# Patient Record
Sex: Female | Born: 1960 | Race: White | Hispanic: No | Marital: Married | Smoking: Never smoker
Health system: Southern US, Community
[De-identification: ages and names within clinical notes are randomized; demographics above are authoritative.]

## PROBLEM LIST (undated history)

## (undated) DIAGNOSIS — Z8619 Personal history of other infectious and parasitic diseases: Secondary | ICD-10-CM

## (undated) DIAGNOSIS — J3089 Other allergic rhinitis: Secondary | ICD-10-CM

## (undated) DIAGNOSIS — M1990 Unspecified osteoarthritis, unspecified site: Secondary | ICD-10-CM

## (undated) DIAGNOSIS — K5140 Inflammatory polyps of colon without complications: Secondary | ICD-10-CM

## (undated) DIAGNOSIS — Z87440 Personal history of urinary (tract) infections: Secondary | ICD-10-CM

## (undated) DIAGNOSIS — G43909 Migraine, unspecified, not intractable, without status migrainosus: Secondary | ICD-10-CM

## (undated) DIAGNOSIS — Z789 Other specified health status: Secondary | ICD-10-CM

## (undated) HISTORY — PX: TUBAL LIGATION: SHX77

## (undated) HISTORY — DX: Unspecified osteoarthritis, unspecified site: M19.90

## (undated) HISTORY — PX: OTHER SURGICAL HISTORY: SHX169

## (undated) HISTORY — DX: Migraine, unspecified, not intractable, without status migrainosus: G43.909

## (undated) HISTORY — PX: BLADDER SURGERY: SHX569

## (undated) HISTORY — DX: Inflammatory polyps of colon without complications: K51.40

## (undated) HISTORY — DX: Personal history of other infectious and parasitic diseases: Z86.19

## (undated) HISTORY — DX: Personal history of urinary (tract) infections: Z87.440

## (undated) HISTORY — PX: ENDOMETRIAL ABLATION: SHX621

## (undated) HISTORY — PX: URETHRAL DILATION: SUR417

---

## 2000-09-16 HISTORY — PX: TUBAL LIGATION: SHX77

## 2004-10-16 ENCOUNTER — Ambulatory Visit: Payer: Self-pay | Admitting: Obstetrics and Gynecology

## 2006-09-16 HISTORY — PX: ENDOMETRIAL ABLATION: SHX621

## 2007-02-10 ENCOUNTER — Ambulatory Visit: Payer: Self-pay | Admitting: Obstetrics and Gynecology

## 2007-03-18 ENCOUNTER — Ambulatory Visit: Payer: Self-pay | Admitting: Obstetrics and Gynecology

## 2007-03-23 ENCOUNTER — Ambulatory Visit: Payer: Self-pay | Admitting: Obstetrics and Gynecology

## 2008-03-08 ENCOUNTER — Ambulatory Visit: Payer: Self-pay | Admitting: Obstetrics and Gynecology

## 2009-03-22 ENCOUNTER — Ambulatory Visit: Payer: Self-pay | Admitting: Obstetrics and Gynecology

## 2010-05-15 ENCOUNTER — Ambulatory Visit: Payer: Self-pay | Admitting: Obstetrics and Gynecology

## 2011-05-21 ENCOUNTER — Ambulatory Visit: Payer: Self-pay | Admitting: Obstetrics and Gynecology

## 2012-05-22 ENCOUNTER — Ambulatory Visit: Payer: Self-pay | Admitting: Obstetrics and Gynecology

## 2014-06-22 ENCOUNTER — Ambulatory Visit: Payer: Self-pay | Admitting: Obstetrics and Gynecology

## 2014-07-29 ENCOUNTER — Ambulatory Visit: Payer: Self-pay | Admitting: Unknown Physician Specialty

## 2015-01-09 LAB — SURGICAL PATHOLOGY

## 2015-06-15 ENCOUNTER — Other Ambulatory Visit: Payer: Self-pay | Admitting: Obstetrics and Gynecology

## 2015-06-15 DIAGNOSIS — Z1231 Encounter for screening mammogram for malignant neoplasm of breast: Secondary | ICD-10-CM

## 2015-06-28 ENCOUNTER — Ambulatory Visit: Payer: Self-pay

## 2015-07-10 ENCOUNTER — Ambulatory Visit: Payer: Self-pay

## 2015-07-13 ENCOUNTER — Ambulatory Visit
Admission: RE | Admit: 2015-07-13 | Discharge: 2015-07-13 | Disposition: A | Discharge status: Home / Self Care | Payer: 59 | Source: Ambulatory Visit | Attending: Obstetrics and Gynecology | Admitting: Obstetrics and Gynecology

## 2015-07-13 DIAGNOSIS — Z1231 Encounter for screening mammogram for malignant neoplasm of breast: Secondary | ICD-10-CM | POA: Diagnosis present

## 2016-06-25 ENCOUNTER — Telehealth: Payer: Self-pay | Admitting: Family Medicine

## 2016-06-25 NOTE — Telephone Encounter (Signed)
Pt was last seen 12/01/2012 and was given Nitrofurantoin Monohyd Macro 100MG  for UTI. Pt stated that she thinks she has another UTI and would like this called into [place]. Pt stated that she changed jobs and her insurance hasn't started yet and that is why she would rather have Rx called in and not come in for OV. Pt stated that she hasn't come in because she has been healthy. I advised pt would need to schedule OV but pt wanted message sent first. If pt needs OV can we re-establish? Please advise. Thanks TNP

## 2016-06-25 NOTE — Telephone Encounter (Signed)
Need to check urinalysis in the office. Can re-establish patient. If she prefers, she could go to and Urgent [facility].

## 2016-06-26 NOTE — Telephone Encounter (Signed)
LMTCB

## 2017-02-06 ENCOUNTER — Encounter: Payer: Self-pay | Admitting: *Deleted

## 2017-02-11 ENCOUNTER — Ambulatory Visit
Admission: RE | Admit: 2017-02-11 | Discharge: 2017-02-11 | Disposition: A | Discharge status: Home / Self Care | Payer: BLUE CROSS/BLUE SHIELD | Source: Ambulatory Visit | Attending: Ophthalmology | Admitting: Ophthalmology

## 2017-02-11 ENCOUNTER — Ambulatory Visit: Payer: BLUE CROSS/BLUE SHIELD | Admitting: Anesthesiology

## 2017-02-11 ENCOUNTER — Encounter: Payer: Self-pay | Admitting: *Deleted

## 2017-02-11 ENCOUNTER — Encounter
Admission: RE | Disposition: A | Discharge status: Home / Self Care | Payer: Self-pay | Source: Ambulatory Visit | Attending: Ophthalmology

## 2017-02-11 DIAGNOSIS — H2512 Age-related nuclear cataract, left eye: Secondary | ICD-10-CM | POA: Diagnosis not present

## 2017-02-11 HISTORY — DX: Other specified health status: Z78.9

## 2017-02-11 HISTORY — PX: CATARACT EXTRACTION W/PHACO: SHX586

## 2017-02-11 LAB — POCT PREGNANCY, URINE: Preg Test, Ur: NEGATIVE

## 2017-02-11 SURGERY — PHACOEMULSIFICATION, CATARACT, WITH IOL INSERTION
Anesthesia: Monitor Anesthesia Care | Site: Eye | Laterality: Left | Wound class: Clean

## 2017-02-11 MED ORDER — MIDAZOLAM HCL 2 MG/2ML IJ SOLN
INTRAMUSCULAR | Status: AC
  Filled 2017-02-11: qty 2

## 2017-02-11 MED ORDER — EPINEPHRINE PF 1 MG/ML IJ SOLN
INTRAOCULAR | Status: DC | PRN
  Administered 2017-02-11: 09:00:00 via OPHTHALMIC

## 2017-02-11 MED ORDER — ARMC OPHTHALMIC DILATING DROPS
OPHTHALMIC | Status: AC
  Administered 2017-02-11: 09:00:00
  Filled 2017-02-11: qty 0.4

## 2017-02-11 MED ORDER — POVIDONE-IODINE 5 % OP SOLN
OPHTHALMIC | Status: DC | PRN
  Administered 2017-02-11: 1 via OPHTHALMIC

## 2017-02-11 MED ORDER — MIDAZOLAM HCL 2 MG/2ML IJ SOLN
INTRAMUSCULAR | Status: DC | PRN
  Administered 2017-02-11: 2 mg via INTRAVENOUS

## 2017-02-11 MED ORDER — MOXIFLOXACIN HCL 0.5 % OP SOLN
OPHTHALMIC | Status: AC
  Filled 2017-02-11: qty 3

## 2017-02-11 MED ORDER — FENTANYL CITRATE (PF) 100 MCG/2ML IJ SOLN
INTRAMUSCULAR | Status: AC
  Filled 2017-02-11: qty 2

## 2017-02-11 MED ORDER — FENTANYL CITRATE (PF) 100 MCG/2ML IJ SOLN
INTRAMUSCULAR | Status: DC | PRN
  Administered 2017-02-11 (×2): 50 ug via INTRAVENOUS

## 2017-02-11 MED ORDER — CARBACHOL 0.01 % IO SOLN
INTRAOCULAR | Status: DC | PRN
  Administered 2017-02-11: 0.5 mL via INTRAOCULAR

## 2017-02-11 MED ORDER — ARMC OPHTHALMIC DILATING DROPS
1.0000 "application " | OPHTHALMIC | Status: AC
  Administered 2017-02-11 (×3): 1 via OPHTHALMIC

## 2017-02-11 MED ORDER — SODIUM CHLORIDE 0.9 % IV SOLN
INTRAVENOUS | Status: DC
  Administered 2017-02-11: 08:00:00 via INTRAVENOUS

## 2017-02-11 MED ORDER — EPINEPHRINE PF 1 MG/ML IJ SOLN
INTRAMUSCULAR | Status: AC
  Filled 2017-02-11: qty 2

## 2017-02-11 MED ORDER — MOXIFLOXACIN HCL 0.5 % OP SOLN
OPHTHALMIC | Status: DC | PRN
  Administered 2017-02-11: 0.2 mL via OPHTHALMIC

## 2017-02-11 MED ORDER — LIDOCAINE HCL (PF) 2 % IJ SOLN
INTRAMUSCULAR | Status: AC
  Filled 2017-02-11: qty 2

## 2017-02-11 MED ORDER — NA CHONDROIT SULF-NA HYALURON 40-17 MG/ML IO SOLN
INTRAOCULAR | Status: DC | PRN
  Administered 2017-02-11: 1 mL via INTRAOCULAR

## 2017-02-11 MED ORDER — POVIDONE-IODINE 5 % OP SOLN
OPHTHALMIC | Status: AC
  Filled 2017-02-11: qty 30

## 2017-02-11 MED ORDER — NA CHONDROIT SULF-NA HYALURON 40-17 MG/ML IO SOLN
INTRAOCULAR | Status: AC
  Filled 2017-02-11: qty 1

## 2017-02-11 MED ORDER — LIDOCAINE HCL (PF) 4 % IJ SOLN
INTRAOCULAR | Status: DC | PRN
  Administered 2017-02-11: 9 mL via OPHTHALMIC

## 2017-02-11 MED ORDER — MOXIFLOXACIN HCL 0.5 % OP SOLN: 1.0000 [drp] | OPHTHALMIC | Status: DC | PRN

## 2017-02-11 SURGICAL SUPPLY — 14 items
GLOVE BIO SURGEON STRL SZ8 (GLOVE) ×3 IMPLANT
GLOVE BIOGEL M 6.5 STRL (GLOVE) ×3 IMPLANT
GLOVE SURG LX 8.0 MICRO (GLOVE) ×2
GLOVE SURG LX STRL 8.0 MICRO (GLOVE) ×1 IMPLANT
GOWN STRL REUS W/ TWL LRG LVL3 (GOWN DISPOSABLE) ×2 IMPLANT
GOWN STRL REUS W/TWL LRG LVL3 (GOWN DISPOSABLE) ×4
LENS IOL TECNIS ITEC 22.5 (Intraocular Lens) ×3 IMPLANT
PACK CATARACT (MISCELLANEOUS) ×3 IMPLANT
PACK CATARACT BRASINGTON LX (MISCELLANEOUS) ×3 IMPLANT
SOL BSS BAG (MISCELLANEOUS) ×3
SOLUTION BSS BAG (MISCELLANEOUS) ×1 IMPLANT
SYR 5ML LL (SYRINGE) ×3 IMPLANT
WATER STERILE IRR 250ML POUR (IV SOLUTION) ×3 IMPLANT
WIPE NON LINTING 3.25X3.25 (MISCELLANEOUS) ×3 IMPLANT

## 2017-02-11 NOTE — Anesthesia Post-op Follow-up Note (Cosign Needed)
Anesthesia QCDR form completed.        

## 2017-02-11 NOTE — Anesthesia Preprocedure Evaluation (Signed)
Anesthesia Evaluation  Patient identified by MRN, date of birth, ID band Patient awake    Reviewed: Allergy & Precautions, H&P , NPO status , Patient's Chart, lab work & pertinent test results  History of Anesthesia Complications Negative for: history of anesthetic complications  Airway Mallampati: III  TM Distance: >3 FB Neck ROM: full    Dental  (+) Chipped   Pulmonary neg pulmonary ROS, neg shortness of breath,    Pulmonary exam normal breath sounds clear to auscultation       Cardiovascular Exercise Tolerance: Good (-) angina(-) Past MI and (-) DOE negative cardio ROS Normal cardiovascular exam Rhythm:regular Rate:Normal     Neuro/Psych negative neurological ROS  negative psych ROS   GI/Hepatic negative GI ROS, Neg liver ROS,   Endo/Other  negative endocrine ROS  Renal/GU      Musculoskeletal   Abdominal   Peds  Hematology negative hematology ROS (+)   Anesthesia Other Findings Past Medical History: No date: Medical history non-contributory  Past Surgical History: No date: BLADDER SURGERY No date: ENDOMETRIAL ABLATION No date: TUBAL LIGATION  BMI    Body Mass Index:  23.37 kg/m      Reproductive/Obstetrics negative OB ROS                             Anesthesia Physical Anesthesia Plan  ASA: I  Anesthesia Plan: MAC   Post-op Pain Management:    Induction: Intravenous  Airway Management Planned: Natural Airway and Nasal Cannula  Additional Equipment:   Intra-op Plan:   Post-operative Plan:   Informed Consent: I have reviewed the patients History and Physical, chart, labs and discussed the procedure including the risks, benefits and alternatives for the proposed anesthesia with the patient or authorized representative who has indicated his/her understanding and acceptance.   Dental Advisory Given  Plan Discussed with: Anesthesiologist, CRNA and  Surgeon  Anesthesia Plan Comments: (Patient consented for risks of anesthesia including but not limited to:  - adverse reactions to medications - damage to teeth, lips or other oral mucosa - sore throat or hoarseness - Damage to heart, brain, lungs or loss of life  Patient voiced understanding.)        Anesthesia Quick Evaluation

## 2017-02-11 NOTE — Op Note (Signed)
PREOPERATIVE DIAGNOSIS:  Nuclear sclerotic cataract of the left eye.   POSTOPERATIVE DIAGNOSIS:  Nuclear sclerotic cataract of the left eye.   OPERATIVE PROCEDURE: Procedure(s): CATARACT EXTRACTION PHACO AND INTRAOCULAR LENS PLACEMENT (IOC)   SURGEON:  Birder Robson, MD.   ANESTHESIA:  Anesthesiologist: Piscitello, Precious Haws, MD CRNA: Aline Brochure, CRNA; Jonna Clark, CRNA  1.      Managed anesthesia care. 2.     0.33ml of Shugarcaine was instilled following the paracentesis   COMPLICATIONS:  None.   TECHNIQUE:   Stop and chop   DESCRIPTION OF PROCEDURE:  The patient was examined and consented in the preoperative holding area where the aforementioned topical anesthesia was applied to the left eye and then brought back to the Operating Room where the left eye was prepped and draped in the usual sterile ophthalmic fashion and a lid speculum was placed. A paracentesis was created with the side port blade and the anterior chamber was filled with viscoelastic. A near clear corneal incision was performed with the steel keratome. A continuous curvilinear capsulorrhexis was performed with a cystotome followed by the capsulorrhexis forceps. Hydrodissection and hydrodelineation were carried out with BSS on a blunt cannula. The lens was removed in a stop and chop  technique and the remaining cortical material was removed with the irrigation-aspiration handpiece. The capsular bag was inflated with viscoelastic and the Technis [id number] lens was placed in the capsular bag without complication. The remaining viscoelastic was removed from the eye with the irrigation-aspiration handpiece. The wounds were hydrated. The anterior chamber was flushed with Miostat and the eye was inflated to physiologic pressure. 0.25ml Vigamox was placed in the anterior chamber. The wounds were found to be water tight. The eye was dressed with Vigamox. The patient was given protective glasses to wear throughout the day and a  shield with which to sleep tonight. The patient was also given drops with which to begin a drop regimen today and will follow-up with me in one day.  Implant Name Type Inv. Item Serial No. Manufacturer Lot No. LRB No. Used  LENS IOL DIOP 22.5 - [id number] Intraocular Lens LENS IOL DIOP 22.5 [id number]   Left 1    Procedure(s) with comments: CATARACT EXTRACTION PHACO AND INTRAOCULAR LENS PLACEMENT (IOC) (Left) - [place] 00:42.2 AP% 12.1 CDE 5.13 Fluid Pack Lot # [id number]  Electronically signed: [place] 02/11/2017 9:48 AM

## 2017-02-11 NOTE — Anesthesia Postprocedure Evaluation (Signed)
Anesthesia Post Note  Patient: Carol Avery  Procedure(s) Performed: Procedure(s) (LRB): CATARACT EXTRACTION PHACO AND INTRAOCULAR LENS PLACEMENT (IOC) (Left)  Patient location during evaluation: Short Stay Anesthesia Type: MAC Level of consciousness: awake, awake and alert and oriented Pain management: pain level controlled Vital Signs Assessment: post-procedure vital signs reviewed and stable Respiratory status: spontaneous breathing Cardiovascular status: stable Postop Assessment: no headache and adequate PO intake Anesthetic complications: no     Last Vitals:  Vitals:   02/11/17 0817 02/11/17 0951  BP: 129/68 114/79  Pulse: 78 70  Resp: 18   Temp: 36.9 C     Last Pain:  Vitals:   02/11/17 0817  TempSrc: Burnett Corrente

## 2017-02-11 NOTE — Transfer of Care (Signed)
Immediate Anesthesia Transfer of Care Note  Patient: Carol Avery  Procedure(s) Performed: Procedure(s) with comments: CATARACT EXTRACTION PHACO AND INTRAOCULAR LENS PLACEMENT (IOC) (Left) - [place] 00:42.2 AP% 12.1 CDE 5.13 Fluid Pack Lot # [id number]  Patient Location: PACU and Short Stay  Anesthesia Type:MAC  Level of Consciousness: awake, alert  and oriented  Airway & Oxygen Therapy: Patient Spontanous Breathing  Post-op Assessment: Report given to RN and Post -op Vital signs reviewed and stable  Post vital signs: Reviewed and stable  Last Vitals:  Vitals:   02/11/17 0817 02/11/17 0951  BP: 129/68 114/79  Pulse: 78 70  Resp: 18   Temp: 36.9 C     Last Pain:  Vitals:   02/11/17 0817  TempSrc: Oral         Complications: No apparent anesthesia complications

## 2017-02-11 NOTE — H&P (Signed)
All labs reviewed. Abnormal studies sent to patients PCP when indicated.  Previous H&P reviewed, patient examined, there are NO CHANGES.  Carol Tillman LOUIS5/29/20189:21 AM

## 2017-02-11 NOTE — Discharge Instructions (Signed)
Eye Surgery Discharge Instructions  Expect mild scratchy sensation or mild soreness. DO NOT RUB YOUR EYE!  The day of surgery:  Minimal physical activity, but bed rest is not required  No reading, computer work, or close hand work  No bending, lifting, or straining.  May watch TV  For 24 hours:  No driving, legal decisions, or alcoholic beverages  Safety precautions  Eat anything you prefer: It is better to start with liquids, then soup then solid foods.  _____ Eye patch should be worn until postoperative exam tomorrow.  ____ Solar shield eyeglasses should be worn for comfort in the sunlight/patch while sleeping  Resume all regular medications including aspirin or Coumadin if these were discontinued prior to surgery. You may shower, bathe, shave, or wash your hair. Tylenol may be taken for mild discomfort.  Call your doctor if you experience significant pain, nausea, or vomiting, fever > 101 or other signs of infection. [phone] or [phone] Specific instructions:  Follow-up Information    Birder Robson, MD Follow up on 02/12/2017.   Specialty:  Ophthalmology Why:  office will call with new time Contact information: [place] [address] [phone]

## 2017-02-24 ENCOUNTER — Encounter: Payer: Self-pay | Admitting: *Deleted

## 2017-03-04 ENCOUNTER — Ambulatory Visit
Admission: RE | Admit: 2017-03-04 | Discharge: 2017-03-04 | Disposition: A | Discharge status: Home / Self Care | Payer: BLUE CROSS/BLUE SHIELD | Source: Ambulatory Visit | Attending: Ophthalmology | Admitting: Ophthalmology

## 2017-03-04 ENCOUNTER — Encounter: Payer: Self-pay | Admitting: *Deleted

## 2017-03-04 ENCOUNTER — Ambulatory Visit: Payer: BLUE CROSS/BLUE SHIELD | Admitting: Anesthesiology

## 2017-03-04 ENCOUNTER — Encounter
Admission: RE | Disposition: A | Discharge status: Home / Self Care | Payer: Self-pay | Source: Ambulatory Visit | Attending: Ophthalmology

## 2017-03-04 DIAGNOSIS — H2511 Age-related nuclear cataract, right eye: Secondary | ICD-10-CM | POA: Diagnosis present

## 2017-03-04 DIAGNOSIS — Z881 Allergy status to other antibiotic agents status: Secondary | ICD-10-CM | POA: Insufficient documentation

## 2017-03-04 HISTORY — DX: Other allergic rhinitis: J30.89

## 2017-03-04 HISTORY — PX: CATARACT EXTRACTION W/PHACO: SHX586

## 2017-03-04 SURGERY — PHACOEMULSIFICATION, CATARACT, WITH IOL INSERTION
Anesthesia: Monitor Anesthesia Care | Site: Eye | Laterality: Right | Wound class: Clean

## 2017-03-04 MED ORDER — MOXIFLOXACIN HCL 0.5 % OP SOLN: 1.0000 [drp] | OPHTHALMIC | Status: DC | PRN

## 2017-03-04 MED ORDER — CARBACHOL 0.01 % IO SOLN
INTRAOCULAR | Status: DC | PRN
  Administered 2017-03-04: .5 mL via INTRAOCULAR

## 2017-03-04 MED ORDER — POVIDONE-IODINE 5 % OP SOLN
OPHTHALMIC | Status: DC | PRN
  Administered 2017-03-04: 1 via OPHTHALMIC

## 2017-03-04 MED ORDER — MOXIFLOXACIN HCL 0.5 % OP SOLN
OPHTHALMIC | Status: AC
  Filled 2017-03-04: qty 3

## 2017-03-04 MED ORDER — ARMC OPHTHALMIC DILATING DROPS
OPHTHALMIC | Status: AC
  Administered 2017-03-04: 1 via OPHTHALMIC
  Filled 2017-03-04: qty 0.4

## 2017-03-04 MED ORDER — ARMC OPHTHALMIC DILATING DROPS
1.0000 "application " | OPHTHALMIC | Status: AC
  Administered 2017-03-04 (×3): 1 via OPHTHALMIC

## 2017-03-04 MED ORDER — FENTANYL CITRATE (PF) 100 MCG/2ML IJ SOLN: 25.0000 ug | INTRAMUSCULAR | Status: DC | PRN

## 2017-03-04 MED ORDER — EPINEPHRINE PF 1 MG/ML IJ SOLN
INTRAMUSCULAR | Status: AC
  Filled 2017-03-04: qty 2

## 2017-03-04 MED ORDER — MIDAZOLAM HCL 2 MG/2ML IJ SOLN
INTRAMUSCULAR | Status: AC
  Filled 2017-03-04: qty 2

## 2017-03-04 MED ORDER — MIDAZOLAM HCL 2 MG/2ML IJ SOLN
INTRAMUSCULAR | Status: DC | PRN
  Administered 2017-03-04 (×2): 1 mg via INTRAVENOUS

## 2017-03-04 MED ORDER — POVIDONE-IODINE 5 % OP SOLN
OPHTHALMIC | Status: AC
  Filled 2017-03-04: qty 30

## 2017-03-04 MED ORDER — EPINEPHRINE PF 1 MG/ML IJ SOLN
INTRAOCULAR | Status: DC | PRN
  Administered 2017-03-04: 1 mL via OPHTHALMIC

## 2017-03-04 MED ORDER — MOXIFLOXACIN HCL 0.5 % OP SOLN
OPHTHALMIC | Status: DC | PRN
  Administered 2017-03-04: .2 mL via OPHTHALMIC

## 2017-03-04 MED ORDER — NA CHONDROIT SULF-NA HYALURON 40-17 MG/ML IO SOLN
INTRAOCULAR | Status: DC | PRN
  Administered 2017-03-04: 1 mL via INTRAOCULAR

## 2017-03-04 MED ORDER — LIDOCAINE HCL (PF) 4 % IJ SOLN
INTRAOCULAR | Status: DC | PRN
  Administered 2017-03-04: 2 mL via OPHTHALMIC

## 2017-03-04 MED ORDER — ONDANSETRON HCL 4 MG/2ML IJ SOLN: 4.0000 mg | Freq: Once | INTRAMUSCULAR | Status: DC | PRN

## 2017-03-04 MED ORDER — SODIUM CHLORIDE 0.9 % IV SOLN
INTRAVENOUS | Status: DC
  Administered 2017-03-04: 10:00:00 via INTRAVENOUS

## 2017-03-04 MED ORDER — NA CHONDROIT SULF-NA HYALURON 40-17 MG/ML IO SOLN
INTRAOCULAR | Status: AC
  Filled 2017-03-04: qty 1

## 2017-03-04 MED ORDER — LIDOCAINE HCL (PF) 4 % IJ SOLN
INTRAMUSCULAR | Status: AC
  Filled 2017-03-04: qty 5

## 2017-03-04 SURGICAL SUPPLY — 14 items
GLOVE BIO SURGEON STRL SZ8 (GLOVE) ×3 IMPLANT
GLOVE BIOGEL M 6.5 STRL (GLOVE) ×3 IMPLANT
GLOVE SURG LX 8.0 MICRO (GLOVE) ×2
GLOVE SURG LX STRL 8.0 MICRO (GLOVE) ×1 IMPLANT
GOWN STRL REUS W/ TWL LRG LVL3 (GOWN DISPOSABLE) ×2 IMPLANT
GOWN STRL REUS W/TWL LRG LVL3 (GOWN DISPOSABLE) ×4
LENS IOL TECNIS ITEC 20.0 (Intraocular Lens) ×3 IMPLANT
PACK CATARACT (MISCELLANEOUS) ×3 IMPLANT
PACK CATARACT BRASINGTON LX (MISCELLANEOUS) ×3 IMPLANT
SOL BSS BAG (MISCELLANEOUS) ×3
SOLUTION BSS BAG (MISCELLANEOUS) ×1 IMPLANT
SYR 5ML LL (SYRINGE) ×3 IMPLANT
WATER STERILE IRR 250ML POUR (IV SOLUTION) ×3 IMPLANT
WIPE NON LINTING 3.25X3.25 (MISCELLANEOUS) ×3 IMPLANT

## 2017-03-04 NOTE — Discharge Instructions (Signed)
Eye Surgery Discharge Instructions  Expect mild scratchy sensation or mild soreness. DO NOT RUB YOUR EYE!  The day of surgery:  Minimal physical activity, but bed rest is not required  No reading, computer work, or close hand work  No bending, lifting, or straining.  May watch TV  For 24 hours:  No driving, legal decisions, or alcoholic beverages  Safety precautions  Eat anything you prefer: It is better to start with liquids, then soup then solid foods.  _____ Eye patch should be worn until postoperative exam tomorrow.  ____ Solar shield eyeglasses should be worn for comfort in the sunlight/patch while sleeping  Resume all regular medications including aspirin or Coumadin if these were discontinued prior to surgery. You may shower, bathe, shave, or wash your hair. Tylenol may be taken for mild discomfort.  Call your doctor if you experience significant pain, nausea, or vomiting, fever > 101 or other signs of infection. [phone] or [phone] Specific instructions:  Follow-up Information    Birder Robson, MD Follow up.   Specialty:  Ophthalmology Why:  June 20 at 9:20am Contact information: [address] [place] [address] [phone]

## 2017-03-04 NOTE — H&P (Signed)
All labs reviewed. Abnormal studies sent to patients PCP when indicated.  Previous H&P reviewed, patient examined, there are NO CHANGES.  Carol Delpilar LOUIS6/19/201811:37 AM

## 2017-03-04 NOTE — Transfer of Care (Signed)
Immediate Anesthesia Transfer of Care Note  Patient: Carol Avery  Procedure(s) Performed: Procedure(s) with comments: CATARACT EXTRACTION PHACO AND INTRAOCULAR LENS PLACEMENT (IOC) (Right) - [place] 00:23 AP% 7.6 CDE 1.75 Fluid pack lot # [id number]  Patient Location: PACU  Anesthesia Type:MAC  Level of Consciousness: awake, alert  and oriented  Airway & Oxygen Therapy: Patient Spontanous Breathing  Post-op Assessment: Report given to RN and Post -op Vital signs reviewed and stable  Post vital signs: Reviewed and stable  Last Vitals:  Vitals:   02/24/17 1326 03/04/17 1006  BP: 111/64 117/68  Pulse: 67 63  Resp:  16  Temp:  37.1 C    Last Pain:  Vitals:   03/04/17 1006  TempSrc: Oral  PainSc: 0-No pain         Complications: No apparent anesthesia complications

## 2017-03-04 NOTE — Anesthesia Post-op Follow-up Note (Cosign Needed)
Anesthesia QCDR form completed.        

## 2017-03-04 NOTE — Anesthesia Postprocedure Evaluation (Signed)
Anesthesia Post Note  Patient: Carol Avery  Procedure(s) Performed: Procedure(s) (LRB): CATARACT EXTRACTION PHACO AND INTRAOCULAR LENS PLACEMENT (IOC) (Right)  Patient location during evaluation: PACU Anesthesia Type: MAC Level of consciousness: oriented and awake and alert Pain management: pain level controlled Vital Signs Assessment: post-procedure vital signs reviewed and stable Respiratory status: spontaneous breathing Cardiovascular status: blood pressure returned to baseline Anesthetic complications: no     Last Vitals:  Vitals:   03/04/17 1209 03/04/17 1218  BP: 111/72 110/65  Pulse: (!) 53 (!) 51  Resp: 12 12  Temp: 36.3 C     Last Pain:  Vitals:   03/04/17 1209  TempSrc: Temporal  PainSc:                  Shallen Luedke

## 2017-03-04 NOTE — Op Note (Signed)
PREOPERATIVE DIAGNOSIS:  Nuclear sclerotic cataract of the right eye.   POSTOPERATIVE DIAGNOSIS:  nuclear sclerotic cataract right eye   OPERATIVE PROCEDURE: Procedure(s): CATARACT EXTRACTION PHACO AND INTRAOCULAR LENS PLACEMENT (IOC)   SURGEON:  Birder Robson, MD.   ANESTHESIA:  Anesthesiologist: Alvin Critchley, MD CRNA: Justus Memory, CRNA; Dionne Bucy, CRNA  1.      Managed anesthesia care. 2.      0.39ml of Shugarcaine was instilled in the eye following the paracentesis.   COMPLICATIONS:  None.   TECHNIQUE:   Stop and chop   DESCRIPTION OF PROCEDURE:  The patient was examined and consented in the preoperative holding area where the aforementioned topical anesthesia was applied to the right eye and then brought back to the Operating Room where the right eye was prepped and draped in the usual sterile ophthalmic fashion and a lid speculum was placed. A paracentesis was created with the side port blade and the anterior chamber was filled with viscoelastic. A near clear corneal incision was performed with the steel keratome. A continuous curvilinear capsulorrhexis was performed with a cystotome followed by the capsulorrhexis forceps. Hydrodissection and hydrodelineation were carried out with BSS on a blunt cannula. The lens was removed in a stop and chop  technique and the remaining cortical material was removed with the irrigation-aspiration handpiece. The capsular bag was inflated with viscoelastic and the Technis ZCB00  lens was placed in the capsular bag without complication. The remaining viscoelastic was removed from the eye with the irrigation-aspiration handpiece. The wounds were hydrated. The anterior chamber was flushed with Miostat and the eye was inflated to physiologic pressure. 0.41ml of Vigamox was placed in the anterior chamber. The wounds were found to be water tight. The eye was dressed with Vigamox. The patient was given protective glasses to wear throughout the day  and a shield with which to sleep tonight. The patient was also given drops with which to begin a drop regimen today and will follow-up with me in one day.  Implant Name Type Inv. Item Serial No. Manufacturer Lot No. LRB No. Used  LENS IOL DIOP 20.0 - [id number] Intraocular Lens LENS IOL DIOP 20.0 [id number] AMO   Right 1   Procedure(s) with comments: CATARACT EXTRACTION PHACO AND INTRAOCULAR LENS PLACEMENT (IOC) (Right) - [place] 00:23 AP% 7.6 CDE 1.75 Fluid pack lot # [id number]  Electronically signed: [place] 03/04/2017 12:05 PM

## 2017-03-04 NOTE — Anesthesia Preprocedure Evaluation (Signed)
Anesthesia Evaluation  Patient identified by MRN, date of birth, ID band Patient awake    Reviewed: Allergy & Precautions, H&P , NPO status , Patient's Chart, lab work & pertinent test results  History of Anesthesia Complications Negative for: history of anesthetic complications  Airway Mallampati: III  TM Distance: >3 FB Neck ROM: full    Dental  (+) Chipped   Pulmonary neg pulmonary ROS, neg shortness of breath,    Pulmonary exam normal breath sounds clear to auscultation       Cardiovascular Exercise Tolerance: Good (-) angina(-) Past MI and (-) DOE negative cardio ROS Normal cardiovascular exam Rhythm:regular Rate:Normal     Neuro/Psych negative neurological ROS  negative psych ROS   GI/Hepatic negative GI ROS, Neg liver ROS,   Endo/Other  negative endocrine ROS  Renal/GU      Musculoskeletal   Abdominal   Peds  Hematology negative hematology ROS (+)   Anesthesia Other Findings Past Medical History: No date: Medical history non-contributory  Past Surgical History: No date: BLADDER SURGERY No date: ENDOMETRIAL ABLATION No date: TUBAL LIGATION  BMI    Body Mass Index:  23.37 kg/m      Reproductive/Obstetrics negative OB ROS                             Anesthesia Physical  Anesthesia Plan  ASA: I  Anesthesia Plan: MAC   Post-op Pain Management:    Induction: Intravenous  PONV Risk Score and Plan:   Airway Management Planned: Natural Airway and Nasal Cannula  Additional Equipment:   Intra-op Plan:   Post-operative Plan:   Informed Consent: I have reviewed the patients History and Physical, chart, labs and discussed the procedure including the risks, benefits and alternatives for the proposed anesthesia with the patient or authorized representative who has indicated his/her understanding and acceptance.   Dental Advisory Given  Plan Discussed with:  Anesthesiologist, CRNA and Surgeon  Anesthesia Plan Comments: (Patient consented for risks of anesthesia including but not limited to:  - adverse reactions to medications - damage to teeth, lips or other oral mucosa - sore throat or hoarseness - Damage to heart, brain, lungs or loss of life  Patient voiced understanding.)        Anesthesia Quick Evaluation

## 2017-07-08 ENCOUNTER — Other Ambulatory Visit: Payer: Self-pay | Admitting: Obstetrics and Gynecology

## 2017-07-08 DIAGNOSIS — Z1231 Encounter for screening mammogram for malignant neoplasm of breast: Secondary | ICD-10-CM

## 2017-07-25 ENCOUNTER — Ambulatory Visit
Admission: RE | Admit: 2017-07-25 | Discharge: 2017-07-25 | Disposition: A | Discharge status: Home / Self Care | Payer: BLUE CROSS/BLUE SHIELD | Source: Ambulatory Visit | Attending: Obstetrics and Gynecology | Admitting: Obstetrics and Gynecology

## 2017-07-25 DIAGNOSIS — Z1231 Encounter for screening mammogram for malignant neoplasm of breast: Secondary | ICD-10-CM | POA: Diagnosis present

## 2018-07-14 ENCOUNTER — Other Ambulatory Visit: Payer: Self-pay | Admitting: Obstetrics and Gynecology

## 2018-07-14 DIAGNOSIS — Z1231 Encounter for screening mammogram for malignant neoplasm of breast: Secondary | ICD-10-CM

## 2018-08-04 ENCOUNTER — Ambulatory Visit
Admission: RE | Admit: 2018-08-04 | Discharge: 2018-08-04 | Disposition: A | Discharge status: Home / Self Care | Payer: BLUE CROSS/BLUE SHIELD | Source: Ambulatory Visit | Attending: Obstetrics and Gynecology | Admitting: Obstetrics and Gynecology

## 2018-08-04 DIAGNOSIS — Z1231 Encounter for screening mammogram for malignant neoplasm of breast: Secondary | ICD-10-CM | POA: Insufficient documentation

## 2020-01-13 ENCOUNTER — Other Ambulatory Visit: Payer: Self-pay | Admitting: Obstetrics and Gynecology

## 2020-01-13 DIAGNOSIS — Z1231 Encounter for screening mammogram for malignant neoplasm of breast: Secondary | ICD-10-CM

## 2020-01-14 ENCOUNTER — Ambulatory Visit
Admission: RE | Admit: 2020-01-14 | Discharge: 2020-01-14 | Disposition: A | Discharge status: Home / Self Care | Payer: 59 | Source: Ambulatory Visit | Attending: Obstetrics and Gynecology | Admitting: Obstetrics and Gynecology

## 2020-01-14 DIAGNOSIS — Z1231 Encounter for screening mammogram for malignant neoplasm of breast: Secondary | ICD-10-CM | POA: Diagnosis present

## 2020-05-26 ENCOUNTER — Other Ambulatory Visit
Admission: RE | Admit: 2020-05-26 | Discharge: 2020-05-26 | Disposition: A | Discharge status: Home / Self Care | Payer: 59 | Source: Ambulatory Visit | Attending: Gastroenterology | Admitting: Gastroenterology

## 2020-05-26 ENCOUNTER — Other Ambulatory Visit: Payer: Self-pay

## 2020-05-26 DIAGNOSIS — Z20822 Contact with and (suspected) exposure to covid-19: Secondary | ICD-10-CM | POA: Insufficient documentation

## 2020-05-26 DIAGNOSIS — Z01812 Encounter for preprocedural laboratory examination: Secondary | ICD-10-CM | POA: Insufficient documentation

## 2020-05-26 LAB — SARS CORONAVIRUS 2 (TAT 6-24 HRS): SARS Coronavirus 2: NEGATIVE

## 2020-05-29 ENCOUNTER — Encounter: Payer: Self-pay | Admitting: *Deleted

## 2020-05-30 ENCOUNTER — Ambulatory Visit
Admission: RE | Admit: 2020-05-30 | Discharge: 2020-05-30 | Disposition: A | Discharge status: Home / Self Care | Payer: 59 | Attending: Gastroenterology | Admitting: Gastroenterology

## 2020-05-30 ENCOUNTER — Ambulatory Visit: Payer: 59 | Admitting: Anesthesiology

## 2020-05-30 ENCOUNTER — Encounter: Payer: Self-pay | Admitting: Anesthesiology

## 2020-05-30 ENCOUNTER — Encounter
Admission: RE | Disposition: A | Discharge status: Home / Self Care | Payer: Self-pay | Source: Home / Self Care | Attending: Gastroenterology

## 2020-05-30 DIAGNOSIS — Z888 Allergy status to other drugs, medicaments and biological substances status: Secondary | ICD-10-CM | POA: Diagnosis not present

## 2020-05-30 DIAGNOSIS — Z8371 Family history of colonic polyps: Secondary | ICD-10-CM | POA: Diagnosis not present

## 2020-05-30 DIAGNOSIS — K5730 Diverticulosis of large intestine without perforation or abscess without bleeding: Secondary | ICD-10-CM | POA: Diagnosis not present

## 2020-05-30 DIAGNOSIS — K621 Rectal polyp: Secondary | ICD-10-CM | POA: Insufficient documentation

## 2020-05-30 DIAGNOSIS — Z86010 Personal history of colonic polyps: Secondary | ICD-10-CM | POA: Diagnosis not present

## 2020-05-30 DIAGNOSIS — D122 Benign neoplasm of ascending colon: Secondary | ICD-10-CM | POA: Diagnosis not present

## 2020-05-30 DIAGNOSIS — Z1211 Encounter for screening for malignant neoplasm of colon: Secondary | ICD-10-CM | POA: Diagnosis present

## 2020-05-30 DIAGNOSIS — Z79899 Other long term (current) drug therapy: Secondary | ICD-10-CM | POA: Diagnosis not present

## 2020-05-30 DIAGNOSIS — K640 First degree hemorrhoids: Secondary | ICD-10-CM | POA: Insufficient documentation

## 2020-05-30 DIAGNOSIS — Z7982 Long term (current) use of aspirin: Secondary | ICD-10-CM | POA: Insufficient documentation

## 2020-05-30 HISTORY — PX: COLONOSCOPY: SHX5424

## 2020-05-30 SURGERY — COLONOSCOPY
Anesthesia: General

## 2020-05-30 MED ORDER — SODIUM CHLORIDE 0.9 % IV SOLN: INTRAVENOUS | Status: DC

## 2020-05-30 MED ORDER — EPHEDRINE SULFATE 50 MG/ML IJ SOLN
INTRAMUSCULAR | Status: DC | PRN
  Administered 2020-05-30: 5 mg via INTRAVENOUS

## 2020-05-30 MED ORDER — PROPOFOL 500 MG/50ML IV EMUL
INTRAVENOUS | Status: DC | PRN
  Administered 2020-05-30: 100 ug/kg/min via INTRAVENOUS

## 2020-05-30 MED ORDER — EPHEDRINE 5 MG/ML INJ
INTRAVENOUS | Status: AC
  Filled 2020-05-30: qty 10

## 2020-05-30 MED ORDER — PROPOFOL 500 MG/50ML IV EMUL
INTRAVENOUS | Status: AC
  Filled 2020-05-30: qty 50

## 2020-05-30 NOTE — H&P (Signed)
Outpatient short stay form Pre-procedure 05/30/2020 7:58 AM Carol Miyamoto MD, MPH  Primary Physician: Dr. Ouida Sills  Reason for visit:  Surveillance Colonoscopy  History of present illness:   59 y/o lady with history of adenomatous colon polyps on colon in 2015 here for surveillance colonoscopy. No blood thinners. No significant abdominal surgeries. No family history of GI malignancies.    Current Facility-Administered Medications:  .  0.9 %  sodium chloride infusion, , Intravenous, Continuous, Mackinley Kiehn, Hilton Cork, MD, Last Rate: 20 mL/hr at 05/30/20 0757, Continued from Pre-op at 05/30/20 0757  Medications Prior to Admission  Medication Sig Dispense Refill Last Dose  . aspirin EC 81 MG tablet Take 81 mg by mouth daily. Swallow whole.     . diphenhydrAMINE (BENADRYL) 25 MG tablet Take 25 mg by mouth every 6 (six) hours as needed.     . docusate sodium (COLACE) 100 MG capsule Take 100 mg by mouth 2 (two) times daily.     . magnesium oxide (MAG-OX) 400 MG tablet Take 400 mg by mouth daily.     . Biotin 5000 MCG CAPS Take 5,000 mcg by mouth daily.     . Black Cohosh 80 MG CAPS Take 80 mg by mouth daily.     . Cholecalciferol (VITAMIN D3) 5000 units CAPS Take 5,000 Units by mouth daily.     . Cyanocobalamin (VITAMIN B-12) 5000 MCG TBDP Take by mouth.     . Multiple Vitamin (MULTIVITAMIN WITH MINERALS) TABS tablet Take 1 tablet by mouth daily.     . Omega-3 Fatty Acids (FISH OIL) 1000 MG CAPS Take 1,000 mg by mouth daily.     Marland Kitchen OVER THE COUNTER MEDICATION Take 1 tablet by mouth daily as needed (tension/anxiety). Reset and relax (magnesium , ashwagandha)     . PREBIOTIC PRODUCT PO Take 750 mg by mouth daily. nutraflora        Allergies  Allergen Reactions  . Other Other (See Comments)    "Mycin family" - since she was a child, unknown reaction     Past Medical History:  Diagnosis Date  . Environmental and seasonal allergies   . Medical history non-contributory     Review  of systems:  Otherwise negative.    Physical Exam  Gen: Alert, oriented. Appears stated age.  HEENT: [place]/AT. PERRLA. Lungs: No respiratory distress Abd: soft, benign, no masses.  Ext: No edema.     Planned procedures: Proceed with colonoscopy. The patient understands the nature of the planned procedure, indications, risks, alternatives and potential complications including but not limited to bleeding, infection, perforation, damage to internal organs and possible oversedation/side effects from anesthesia. The patient agrees and gives consent to proceed.  Please refer to procedure notes for findings, recommendations and patient disposition/instructions.     Carol Miyamoto MD, MPH Gastroenterology 05/30/2020  7:58 AM

## 2020-05-30 NOTE — Anesthesia Preprocedure Evaluation (Addendum)
Anesthesia Evaluation  Patient identified by MRN, date of birth, ID band Patient awake    Reviewed: Allergy & Precautions, H&P , NPO status , Patient's Chart, lab work & pertinent test results  History of Anesthesia Complications Negative for: history of anesthetic complications  Airway Mallampati: II  TM Distance: >3 FB Neck ROM: full    Dental  (+) Teeth Intact   Pulmonary neg pulmonary ROS, neg sleep apnea, neg COPD,    breath sounds clear to auscultation       Cardiovascular (-) angina(-) Past MI and (-) Cardiac Stents negative cardio ROS  (-) dysrhythmias  Rhythm:regular Rate:Normal     Neuro/Psych negative neurological ROS  negative psych ROS   GI/Hepatic negative GI ROS, Neg liver ROS,   Endo/Other  negative endocrine ROS  Renal/GU negative Renal ROS  negative genitourinary   Musculoskeletal   Abdominal   Peds  Hematology negative hematology ROS (+)   Anesthesia Other Findings Past Medical History: No date: Environmental and seasonal allergies No date: Medical history non-contributory  Past Surgical History: No date: back tumor No date: BLADDER SURGERY 02/11/2017: CATARACT EXTRACTION W/PHACO; Left     Comment:  Procedure: CATARACT EXTRACTION PHACO AND INTRAOCULAR               LENS PLACEMENT (IOC);  Surgeon: Birder Robson, MD;                Location: [facility] ORS;  Service: Ophthalmology;  Laterality:              Left;  [place] 00:42.2 AP% 12.1 CDE 5.13 Fluid Pack Lot #               [id number] 03/04/2017: CATARACT EXTRACTION W/PHACO; Right     Comment:  Procedure: CATARACT EXTRACTION PHACO AND INTRAOCULAR               LENS PLACEMENT (IOC);  Surgeon: Birder Robson, MD;                Location: [facility] ORS;  Service: Ophthalmology;  Laterality:              Right;  [place] 00:23 AP% 7.6 CDE 1.75 Fluid pack lot #               [id number] No date: ENDOMETRIAL ABLATION No date: TUBAL LIGATION No date:  URETHRAL DILATION     Reproductive/Obstetrics negative OB ROS                            Anesthesia Physical Anesthesia Plan  ASA: II  Anesthesia Plan: General   Post-op Pain Management:    Induction:   PONV Risk Score and Plan: Propofol infusion and TIVA  Airway Management Planned: Nasal Cannula  Additional Equipment:   Intra-op Plan:   Post-operative Plan:   Informed Consent: I have reviewed the patients History and Physical, chart, labs and discussed the procedure including the risks, benefits and alternatives for the proposed anesthesia with the patient or authorized representative who has indicated his/her understanding and acceptance.     Dental Advisory Given  Plan Discussed with: Anesthesiologist, CRNA and Surgeon  Anesthesia Plan Comments:        Anesthesia Quick Evaluation

## 2020-05-30 NOTE — Interval H&P Note (Signed)
History and Physical Interval Note:  05/30/2020 8:00 AM  Carol Avery  has presented today for surgery, with the diagnosis of PERSONAL HX.OF COLON POLYPS.  The various methods of treatment have been discussed with the patient and family. After consideration of risks, benefits and other options for treatment, the patient has consented to  Procedure(s): COLONOSCOPY (N/A) as a surgical intervention.  The patient's history has been reviewed, patient examined, no change in status, stable for surgery.  I have reviewed the patient's chart and labs.  Questions were answered to the patient's satisfaction.     Lesly Rubenstein  Ok to proceed with colonoscopy.

## 2020-05-30 NOTE — Anesthesia Postprocedure Evaluation (Signed)
Anesthesia Post Note  Patient: Carol Avery  Procedure(s) Performed: COLONOSCOPY (N/A )  Patient location during evaluation: PACU Anesthesia Type: General Level of consciousness: awake and alert Pain management: pain level controlled Vital Signs Assessment: post-procedure vital signs reviewed and stable Respiratory status: spontaneous breathing, nonlabored ventilation and respiratory function stable Cardiovascular status: blood pressure returned to baseline and stable Postop Assessment: no apparent nausea or vomiting Anesthetic complications: no   No complications documented.   Last Vitals:  Vitals:   05/30/20 0902 05/30/20 0912  BP: 121/78 136/88  Pulse:    Resp:    Temp:    SpO2:      Last Pain:  Vitals:   05/30/20 0912  TempSrc:   PainSc: 0-No pain                 Tera Mater

## 2020-05-30 NOTE — Transfer of Care (Signed)
Immediate Anesthesia Transfer of Care Note  Patient: Carol Avery  Procedure(s) Performed: COLONOSCOPY (N/A )  Patient Location: PACU  Anesthesia Type:General  Level of Consciousness: awake and sedated  Airway & Oxygen Therapy: Patient Spontanous Breathing and Patient connected to nasal cannula oxygen  Post-op Assessment: Report given to RN and Post -op Vital signs reviewed and stable  Post vital signs: Reviewed and stable  Last Vitals:  Vitals Value Taken Time  BP    Temp    Pulse    Resp    SpO2      Last Pain:  Vitals:   05/30/20 0741  TempSrc: Tympanic  PainSc: 0-No pain         Complications: No complications documented.

## 2020-05-30 NOTE — Progress Notes (Signed)
   05/30/20 0745  Clinical Encounter Type  Visited With Family  Visit Type Initial  Referral From Chaplain  Consult/Referral To Chaplain  While rounding SDS waiting area, chaplain spoke with pt's husband. He said all is well and he doesn't have any questions or concerns. Chaplain wished him well and left.

## 2020-05-30 NOTE — Anesthesia Procedure Notes (Signed)
Performed by: Cook-Martin, Emanuele Mcwhirter Pre-anesthesia Checklist: Patient identified, Emergency Drugs available, Suction available, Patient being monitored and Timeout performed Patient Re-evaluated:Patient Re-evaluated prior to induction Oxygen Delivery Method: Nasal cannula Preoxygenation: Pre-oxygenation with 100% oxygen Induction Type: IV induction Placement Confirmation: positive ETCO2 and CO2 detector       

## 2020-05-30 NOTE — Op Note (Signed)
[facility] Gastroenterology Patient Name: Carol Avery Procedure Date: 05/30/2020 7:39 AM MRN: [id number] Account #: [id number] Date of Birth: [birth year] Admit Type: Outpatient Age: 59 Room: [facility] ENDO ROOM 3 Gender: Female Note Status: Finalized Procedure:             Colonoscopy Indications:           High risk colon cancer surveillance: Personal history                         of colonic polyps, Family history of colonic polyps in                         a first-degree relative Providers:             Andrey Farmer MD, MD Referring MD:          Boykin Nearing, MD (Referring MD) Medicines:             Monitored Anesthesia Care Complications:         No immediate complications. Estimated blood loss:                         Minimal. Procedure:             Pre-Anesthesia Assessment:                        - Prior to the procedure, a History and Physical was                         performed, and patient medications and allergies were                         reviewed. The patient is competent. The risks and                         benefits of the procedure and the sedation options and                         risks were discussed with the patient. All questions                         were answered and informed consent was obtained.                         Patient identification and proposed procedure were                         verified by the physician, the nurse, the anesthetist                         and the technician in the endoscopy suite. Mental                         Status Examination: alert and oriented. Airway                         Examination: normal oropharyngeal airway and neck  mobility. Respiratory Examination: clear to                         auscultation. CV Examination: normal. Prophylactic                         Antibiotics: The patient does not require prophylactic                         antibiotics.  Prior Anticoagulants: The patient has                         taken no previous anticoagulant or antiplatelet                         agents. ASA Grade Assessment: II - A patient with mild                         systemic disease. After reviewing the risks and                         benefits, the patient was deemed in satisfactory                         condition to undergo the procedure. The anesthesia                         plan was to use monitored anesthesia care (MAC).                         Immediately prior to administration of medications,                         the patient was re-assessed for adequacy to receive                         sedatives. The heart rate, respiratory rate, oxygen                         saturations, blood pressure, adequacy of pulmonary                         ventilation, and response to care were monitored                         throughout the procedure. The physical status of the                         patient was re-assessed after the procedure.                        After obtaining informed consent, the colonoscope was                         passed under direct vision. Throughout the procedure,                         the patient's blood pressure, pulse, and oxygen  saturations were monitored continuously. The                         Colonoscope was introduced through the anus and                         advanced to the the cecum, identified by appendiceal                         orifice and ileocecal valve. The colonoscopy was                         performed without difficulty. The patient tolerated                         the procedure well. The quality of the bowel                         preparation was good. Findings:      The perianal and digital rectal examinations were normal.      A 2 mm polyp was found in the ascending colon. The polyp was sessile.       The polyp was removed with a jumbo cold forceps.  Resection and retrieval       were complete. Estimated blood loss was minimal.      Two hyperplastic polyps were found in the rectum. The polyps were less       than 1 mm in size. These polyps were removed with a jumbo cold forceps.       Resection and retrieval were complete. Estimated blood loss was minimal.      A single small-mouthed diverticulum was found in the sigmoid colon.      Non-bleeding internal hemorrhoids were found during retroflexion. The       hemorrhoids were Grade I (internal hemorrhoids that do not prolapse).      The exam was otherwise without abnormality on direct and retroflexion       views. Impression:            - One 2 mm polyp in the ascending colon, removed with                         a jumbo cold forceps. Resected and retrieved.                        - Two less than 1 mm polyps in the rectum, removed                         with a jumbo cold forceps. Resected and retrieved.                        - Diverticulosis in the sigmoid colon.                        - Non-bleeding internal hemorrhoids.                        - The examination was otherwise normal on direct and  retroflexion views. Recommendation:        - Discharge patient to home.                        - Resume previous diet.                        - Continue present medications.                        - Await pathology results.                        - Repeat colonoscopy for surveillance based on                         pathology results.                        - Return to referring physician as previously                         scheduled. Procedure Code(s):     --- Professional ---                        [phone], Colonoscopy, flexible; with biopsy, single or                         multiple Diagnosis Code(s):     --- Professional ---                        Z86.010, Personal history of colonic polyps                        K63.5, Polyp of colon                         K62.1, Rectal polyp                        K64.0, First degree hemorrhoids                        Z83.71, Family history of colonic polyps                        K57.30, Diverticulosis of large intestine without                         perforation or abscess without bleeding CPT copyright 2019 [employer]. All rights reserved. The codes documented in this report are preliminary and upon coder review may  be revised to meet current compliance requirements. Andrey Farmer, MD Andrey Farmer MD, MD 05/30/2020 8:42:13 AM Number of Addenda: 0 Note Initiated On: 05/30/2020 7:39 AM Scope Withdrawal Time: 0 hours 12 minutes 48 seconds  Total Procedure Duration: 0 hours 24 minutes 13 seconds  Estimated Blood Loss:  Estimated blood loss was minimal.      [facility]

## 2020-05-31 LAB — SURGICAL PATHOLOGY

## 2021-02-08 ENCOUNTER — Other Ambulatory Visit: Payer: Self-pay | Admitting: Obstetrics and Gynecology

## 2021-02-08 ENCOUNTER — Other Ambulatory Visit: Payer: Self-pay

## 2021-02-08 ENCOUNTER — Ambulatory Visit
Admission: RE | Admit: 2021-02-08 | Discharge: 2021-02-08 | Disposition: A | Discharge status: Home / Self Care | Payer: 59 | Source: Ambulatory Visit | Attending: Obstetrics and Gynecology | Admitting: Obstetrics and Gynecology

## 2021-02-08 DIAGNOSIS — Z1231 Encounter for screening mammogram for malignant neoplasm of breast: Secondary | ICD-10-CM | POA: Insufficient documentation

## 2021-02-08 LAB — HM PAP SMEAR: HM Pap smear: NEGATIVE

## 2021-02-08 LAB — RESULTS CONSOLE HPV: CHL HPV: NEGATIVE

## 2022-03-22 ENCOUNTER — Other Ambulatory Visit: Payer: Self-pay | Admitting: Family Medicine

## 2022-03-22 DIAGNOSIS — Z1231 Encounter for screening mammogram for malignant neoplasm of breast: Secondary | ICD-10-CM

## 2022-04-12 ENCOUNTER — Ambulatory Visit
Admission: RE | Admit: 2022-04-12 | Discharge: 2022-04-12 | Disposition: A | Discharge status: Home / Self Care | Payer: Commercial Managed Care - HMO | Source: Ambulatory Visit | Attending: Family Medicine | Admitting: Family Medicine

## 2022-04-12 DIAGNOSIS — Z1231 Encounter for screening mammogram for malignant neoplasm of breast: Secondary | ICD-10-CM | POA: Insufficient documentation

## 2022-07-11 IMAGING — MG MM DIGITAL SCREENING BILAT W/ TOMO AND CAD
8 series · 9 of 24 positions shown · non-contrast
Comparison: Previous exam(s).

CLINICAL DATA: Screening.

EXAM:
DIGITAL SCREENING BILATERAL MAMMOGRAM WITH TOMOSYNTHESIS AND CAD
TECHNIQUE: Bilateral screening digital craniocaudal and mediolateral oblique
mammograms were obtained. Bilateral screening digital breast
tomosynthesis was performed. The images were evaluated with
computer-aided detection.

[R CC synth-2D]
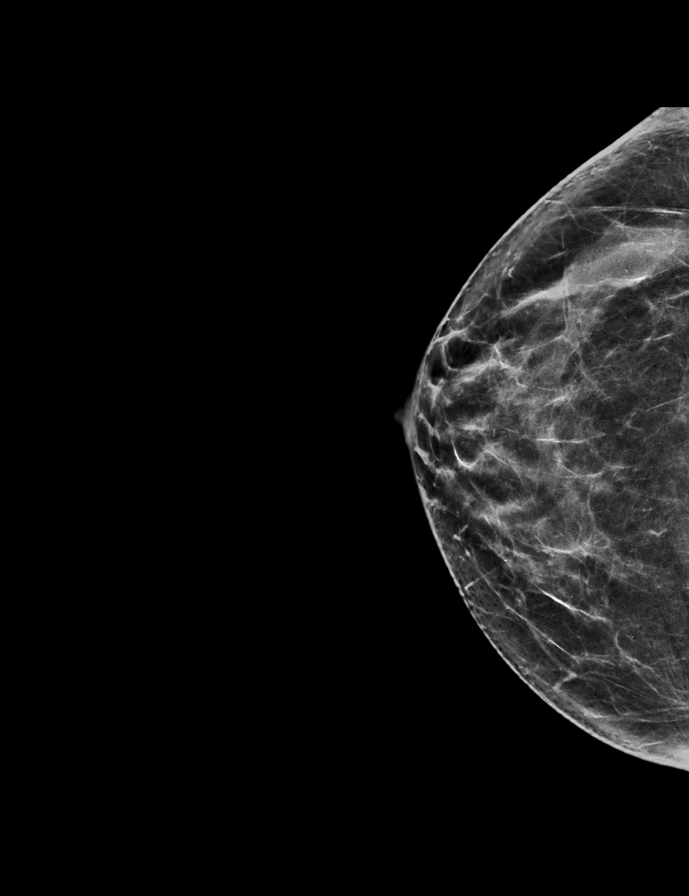

[L CC synth-2D]
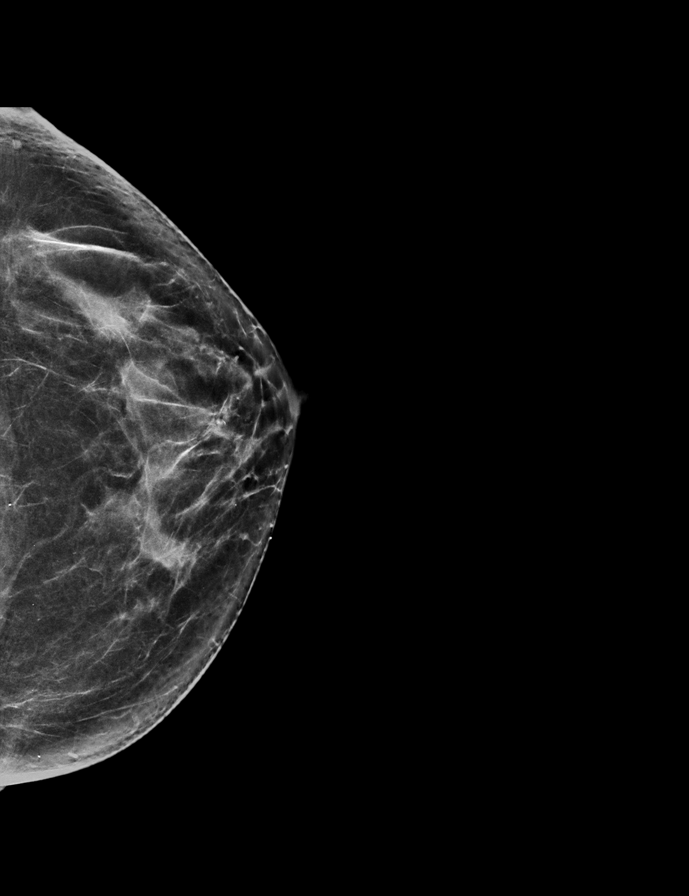

[L MLO synth-2D]
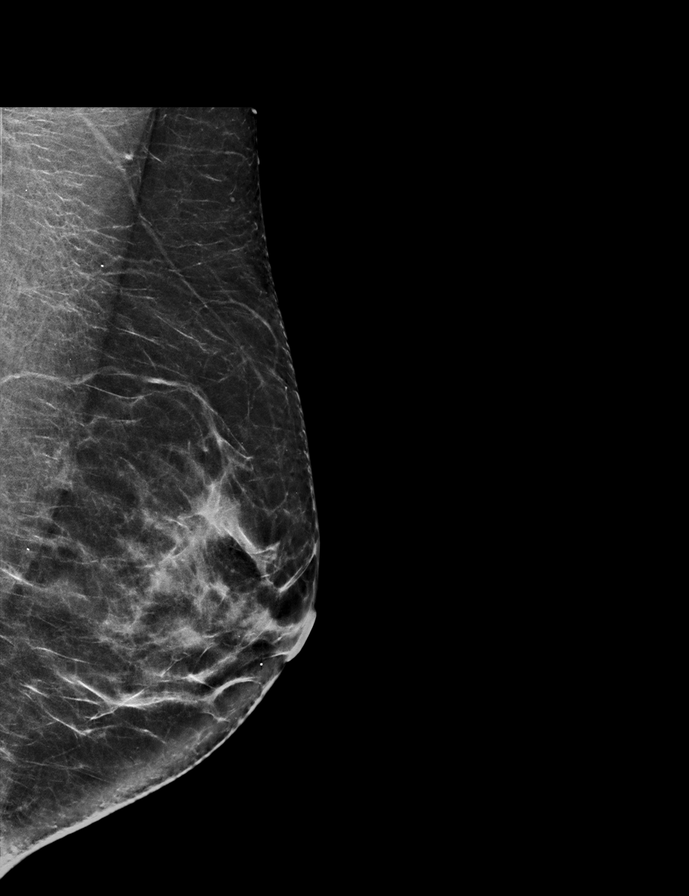

[R MLO synth-2D]
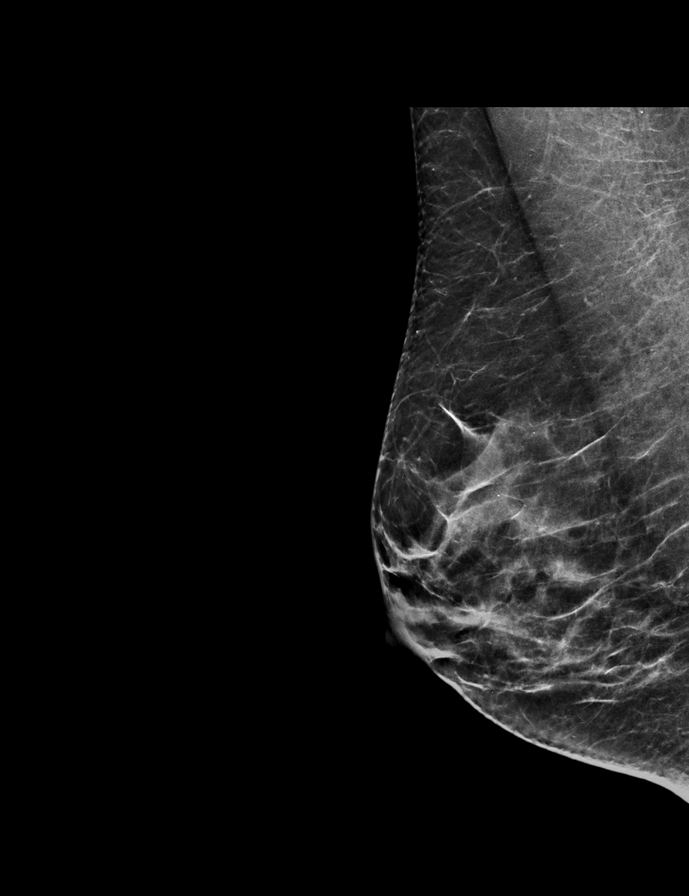

[R CC tomo · 2 of 59 frames shown]
[frame 20/59]
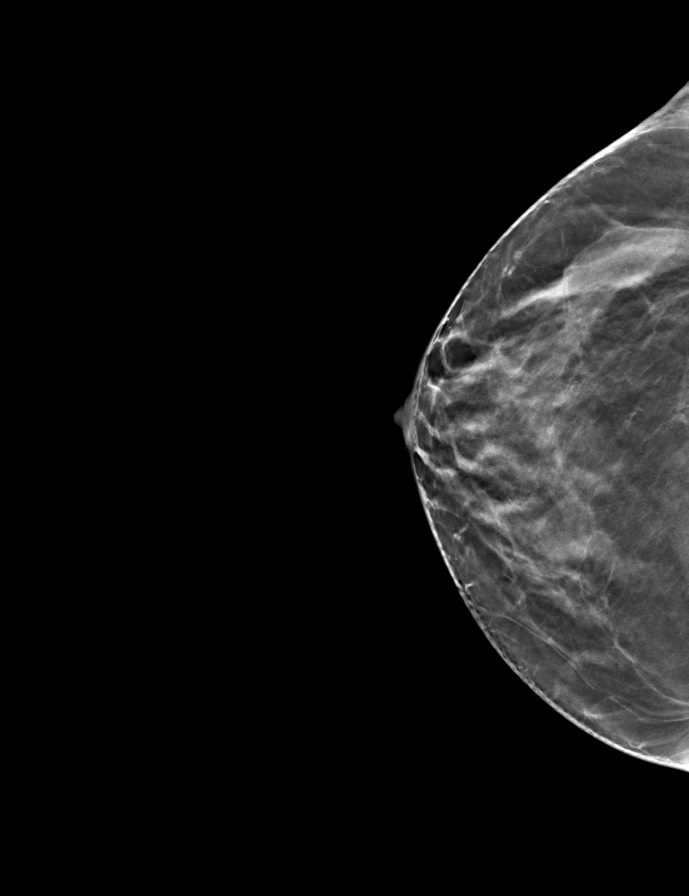
[frame 30/59]
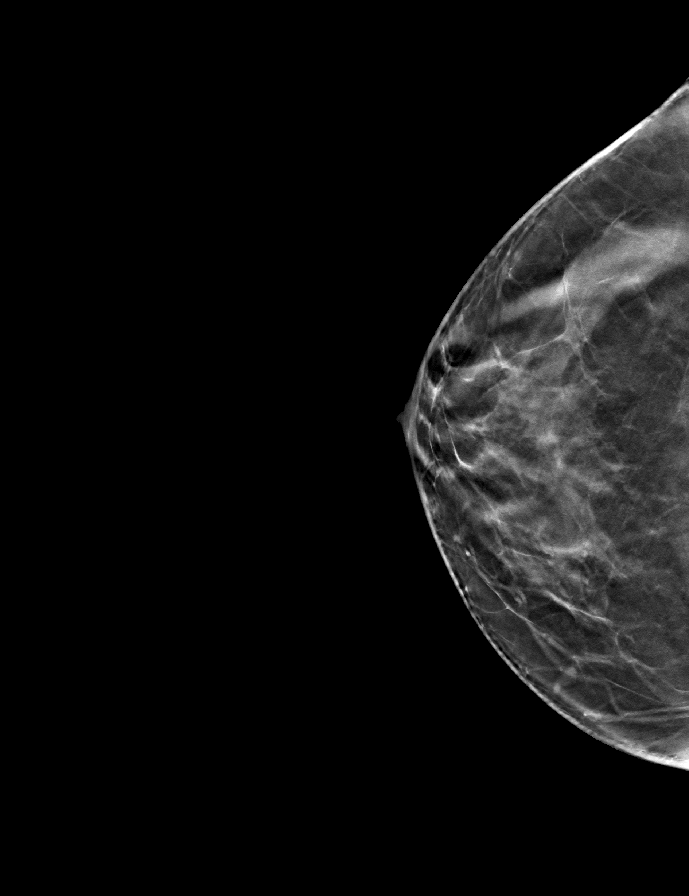

[L CC tomo · tomo slice 33/66.0]
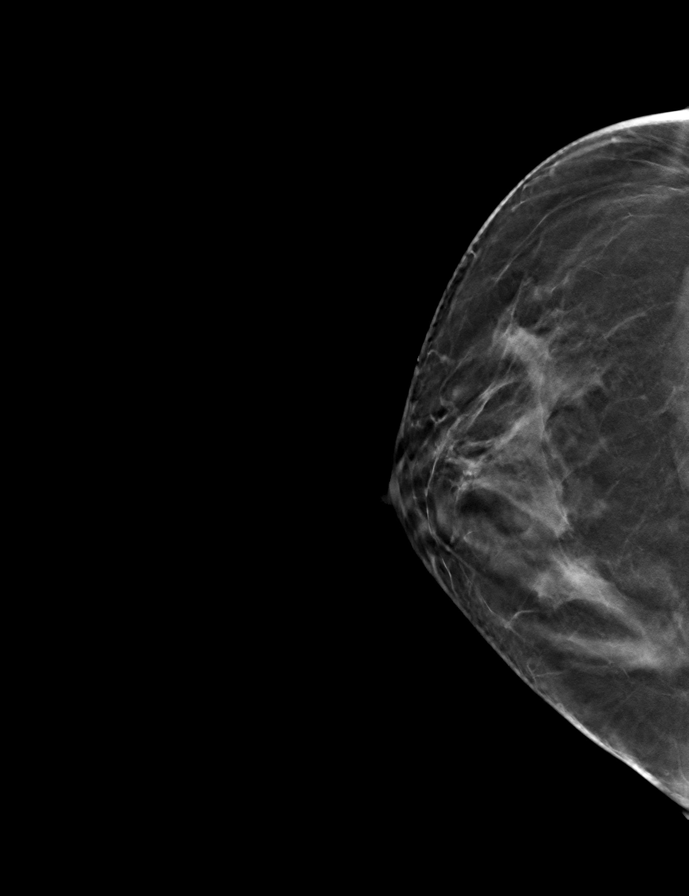

[L MLO tomo · tomo slice 34/67.0]
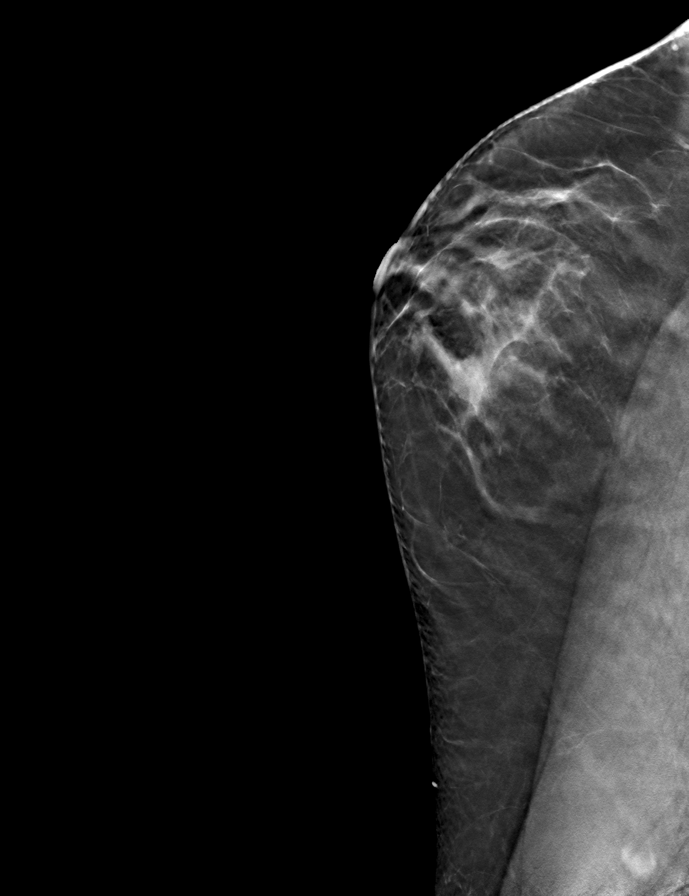

[R MLO tomo · tomo slice 30/59.0]
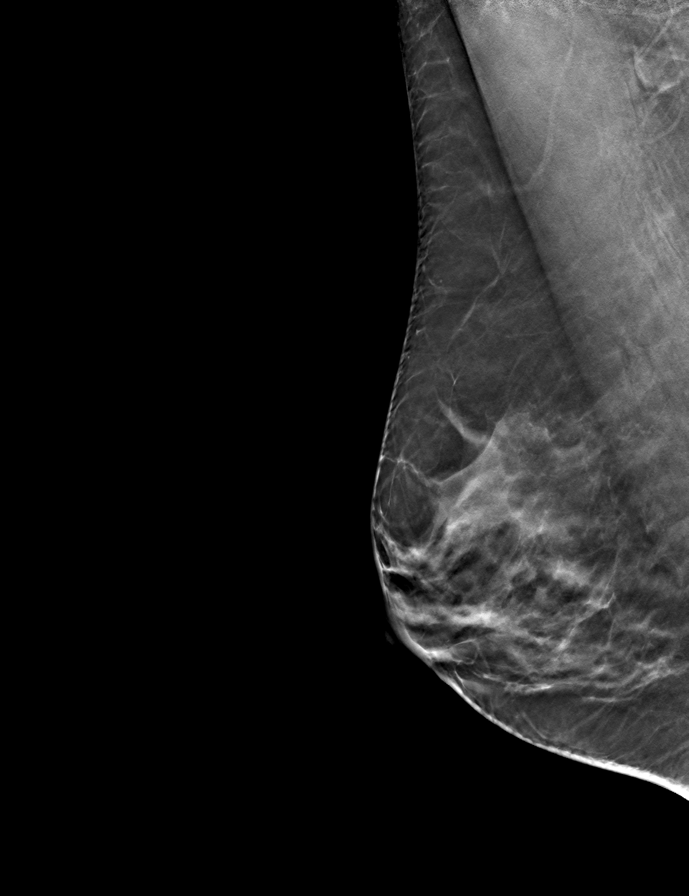

[9 of 24 positions shown; findings below may reference images not displayed]

ACR Breast Density Category c: The breast tissue is heterogeneously
dense, which may obscure small masses.
FINDINGS: There are no findings suspicious for malignancy. The images were
evaluated with computer-aided detection.
IMPRESSION: No mammographic evidence of malignancy. A result letter of this
screening mammogram will be mailed directly to the patient.

RECOMMENDATION:
Screening mammogram in one year. (Code:T4-5-GWO)

BI-RADS CATEGORY  1: Negative.

## 2023-04-03 ENCOUNTER — Other Ambulatory Visit: Payer: Self-pay | Admitting: Family Medicine

## 2023-04-03 DIAGNOSIS — Z1231 Encounter for screening mammogram for malignant neoplasm of breast: Secondary | ICD-10-CM

## 2023-04-03 DIAGNOSIS — R7303 Prediabetes: Secondary | ICD-10-CM | POA: Insufficient documentation

## 2023-04-23 ENCOUNTER — Ambulatory Visit
Admission: RE | Admit: 2023-04-23 | Discharge: 2023-04-23 | Disposition: A | Discharge status: Home / Self Care | Payer: No Typology Code available for payment source | Source: Ambulatory Visit | Attending: Family Medicine | Admitting: Family Medicine

## 2023-04-23 DIAGNOSIS — Z1231 Encounter for screening mammogram for malignant neoplasm of breast: Secondary | ICD-10-CM | POA: Diagnosis not present

## 2024-04-04 ENCOUNTER — Encounter: Payer: Self-pay | Admitting: Family Medicine

## 2024-04-04 NOTE — Progress Notes (Unsigned)
     Lenor Provencher T. Kylan Veach, MD, CAQ Sports Medicine [facility] [address] [place], [address]  Phone: [phone]  FAX: [phone]  Sueanne Maniaci - 63 y.o. female  MRN [id number]  Date of Birth: [birth year]  Date: 04/07/2024  PCP: Harvey Gaetana CROME, NP  Referral: Harvey Gaetana CROME, NP  No chief complaint on file.  Subjective:   Carol Avery is a 63 y.o. very pleasant female patient with There is no height or weight on file to calculate BMI. who presents with the following:    Review of Systems is noted in the HPI, as appropriate  Objective:   LMP 02/12/2007 Comment: ablation, upreg neg  GEN: No acute distress; alert,appropriate. PULM: Breathing comfortably in no respiratory distress PSYCH: Normally interactive.   Laboratory and Imaging Data:  Assessment and Plan:   ***

## 2024-04-07 ENCOUNTER — Ambulatory Visit: Payer: No Typology Code available for payment source | Admitting: Family Medicine

## 2024-04-07 ENCOUNTER — Encounter: Payer: Self-pay | Admitting: Family Medicine

## 2024-04-07 VITALS — BP 100/62 | HR 74 | Temp 98.4°F | Ht 67.5 in | Wt 168.4 lb

## 2024-04-07 DIAGNOSIS — Z0000 Encounter for general adult medical examination without abnormal findings: Secondary | ICD-10-CM

## 2024-04-07 DIAGNOSIS — E782 Mixed hyperlipidemia: Secondary | ICD-10-CM | POA: Diagnosis not present

## 2024-04-07 DIAGNOSIS — Z1159 Encounter for screening for other viral diseases: Secondary | ICD-10-CM

## 2024-04-07 DIAGNOSIS — R5383 Other fatigue: Secondary | ICD-10-CM

## 2024-04-07 DIAGNOSIS — Z114 Encounter for screening for human immunodeficiency virus [HIV]: Secondary | ICD-10-CM

## 2024-04-07 DIAGNOSIS — Z23 Encounter for immunization: Secondary | ICD-10-CM

## 2024-04-07 DIAGNOSIS — R7303 Prediabetes: Secondary | ICD-10-CM | POA: Diagnosis not present

## 2024-04-07 NOTE — Patient Instructions (Addendum)
 Try Miralax for constipation  You do not need a referral to make a mammogram appointment, and you may call to make her own mammogram appointment directly around your schedule.  [place] [place] [facility] [phone] [address] [place] [address] [place], [place] [address]  [facility] ([facility]) [address] [place], [place] [address] Phone: [phone] Toll Free: [phone]  MAMMOGRAPHY IN [place]:  [facility] or [place]) [phone] Located on the campus of [facility])  [facility] [place] [facility] Location) [address]  [place], [place] [address]

## 2024-04-09 ENCOUNTER — Other Ambulatory Visit (INDEPENDENT_AMBULATORY_CARE_PROVIDER_SITE_OTHER)

## 2024-04-09 DIAGNOSIS — R5383 Other fatigue: Secondary | ICD-10-CM | POA: Diagnosis not present

## 2024-04-09 DIAGNOSIS — R7303 Prediabetes: Secondary | ICD-10-CM

## 2024-04-09 DIAGNOSIS — Z114 Encounter for screening for human immunodeficiency virus [HIV]: Secondary | ICD-10-CM | POA: Diagnosis not present

## 2024-04-09 DIAGNOSIS — Z1159 Encounter for screening for other viral diseases: Secondary | ICD-10-CM

## 2024-04-09 DIAGNOSIS — E782 Mixed hyperlipidemia: Secondary | ICD-10-CM | POA: Diagnosis not present

## 2024-04-09 LAB — CBC WITH DIFFERENTIAL/PLATELET
Basophils Absolute: 0.1 K/uL (ref 0.0–0.1)
Basophils Relative: 1.1 % (ref 0.0–3.0)
Eosinophils Absolute: 0.2 K/uL (ref 0.0–0.7)
Eosinophils Relative: 4.7 % (ref 0.0–5.0)
HCT: 41.8 % (ref 36.0–46.0)
Hemoglobin: 13.7 g/dL (ref 12.0–15.0)
Lymphocytes Relative: 19.2 % (ref 12.0–46.0)
Lymphs Abs: 0.9 K/uL (ref 0.7–4.0)
MCHC: 32.7 g/dL (ref 30.0–36.0)
MCV: 85.6 fl (ref 78.0–100.0)
Monocytes Absolute: 0.5 K/uL (ref 0.1–1.0)
Monocytes Relative: 10.1 % (ref 3.0–12.0)
Neutro Abs: 3.2 K/uL (ref 1.4–7.7)
Neutrophils Relative %: 64.9 % (ref 43.0–77.0)
Platelets: 265 K/uL (ref 150.0–400.0)
RBC: 4.88 Mil/uL (ref 3.87–5.11)
RDW: 13.6 % (ref 11.5–15.5)
WBC: 4.9 K/uL (ref 4.0–10.5)

## 2024-04-09 LAB — TSH: TSH: 3.47 u[IU]/mL (ref 0.35–5.50)

## 2024-04-09 LAB — HEPATIC FUNCTION PANEL
ALT: 15 U/L (ref 0–35)
AST: 15 U/L (ref 0–37)
Albumin: 4.6 g/dL (ref 3.5–5.2)
Alkaline Phosphatase: 56 U/L (ref 39–117)
Bilirubin, Direct: 0.1 mg/dL (ref 0.0–0.3)
Total Bilirubin: 0.4 mg/dL (ref 0.2–1.2)
Total Protein: 7.2 g/dL (ref 6.0–8.3)

## 2024-04-09 LAB — BASIC METABOLIC PANEL WITH GFR
BUN: 11 mg/dL (ref 6–23)
CO2: 30 meq/L (ref 19–32)
Calcium: 9.8 mg/dL (ref 8.4–10.5)
Chloride: 105 meq/L (ref 96–112)
Creatinine, Ser: 0.86 mg/dL (ref 0.40–1.20)
GFR: 72.25 mL/min (ref >60.00)
Glucose, Bld: 89 mg/dL (ref 70–99)
Potassium: 4.9 meq/L (ref 3.5–5.1)
Sodium: 142 meq/L (ref 135–145)

## 2024-04-09 LAB — LIPID PANEL
Cholesterol: 236 mg/dL — ABNORMAL HIGH (ref 0–200)
HDL: 65 mg/dL (ref >39.00)
LDL Cholesterol: 145 mg/dL — ABNORMAL HIGH (ref 0–99)
NonHDL: 170.99
Total CHOL/HDL Ratio: 4
Triglycerides: 132 mg/dL (ref 0.0–149.0)
VLDL: 26.4 mg/dL (ref 0.0–40.0)

## 2024-04-09 LAB — HEMOGLOBIN A1C: Hgb A1c MFr Bld: 5.8 % (ref 4.6–6.5)

## 2024-04-10 LAB — HIV ANTIBODY (ROUTINE TESTING W REFLEX): HIV 1&2 Ab, 4th Generation: NONREACTIVE

## 2024-04-10 LAB — HEPATITIS C ANTIBODY: Hepatitis C Ab: NONREACTIVE

## 2024-04-12 ENCOUNTER — Ambulatory Visit: Payer: Self-pay | Admitting: Family Medicine

## 2024-04-12 ENCOUNTER — Other Ambulatory Visit: Payer: Self-pay | Admitting: Family Medicine

## 2024-04-12 DIAGNOSIS — Z1231 Encounter for screening mammogram for malignant neoplasm of breast: Secondary | ICD-10-CM

## 2024-04-27 ENCOUNTER — Ambulatory Visit
Admission: RE | Admit: 2024-04-27 | Discharge: 2024-04-27 | Disposition: A | Discharge status: Home / Self Care | Source: Ambulatory Visit | Attending: Family Medicine | Admitting: Family Medicine

## 2024-04-27 DIAGNOSIS — Z1231 Encounter for screening mammogram for malignant neoplasm of breast: Secondary | ICD-10-CM | POA: Insufficient documentation

## 2024-05-31 ENCOUNTER — Other Ambulatory Visit: Payer: Self-pay | Admitting: Medical Genetics

## 2024-06-01 ENCOUNTER — Other Ambulatory Visit
Admission: RE | Admit: 2024-06-01 | Discharge: 2024-06-01 | Disposition: A | Discharge status: Home / Self Care | Payer: Self-pay | Source: Ambulatory Visit | Attending: Medical Genetics | Admitting: Medical Genetics

## 2024-06-11 LAB — GENECONNECT MOLECULAR SCREEN: Genetic Analysis Overall Interpretation: NEGATIVE

## 2024-09-15 ENCOUNTER — Ambulatory Visit (INDEPENDENT_AMBULATORY_CARE_PROVIDER_SITE_OTHER): Admitting: *Deleted

## 2024-09-15 ENCOUNTER — Ambulatory Visit

## 2024-09-15 DIAGNOSIS — Z23 Encounter for immunization: Secondary | ICD-10-CM

## 2024-09-15 NOTE — Progress Notes (Signed)
 Per orders of Dr. Watt, injection of Shingrix  given by Wendell Arland Pesa. Patient tolerated injection well.

## 2024-09-22 ENCOUNTER — Ambulatory Visit
